# Patient Record
Sex: Male | Born: 1990 | Race: White | Hispanic: No | Marital: Single | Smoking: Never smoker
Health system: Southern US, Community
[De-identification: ages and names within clinical notes are randomized; demographics above are authoritative.]

## PROBLEM LIST (undated history)

## (undated) DIAGNOSIS — S02609A Fracture of mandible, unspecified, initial encounter for closed fracture: Secondary | ICD-10-CM

## (undated) HISTORY — DX: Fracture of mandible, unspecified, initial encounter for closed fracture: S02.609A

---
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
## 2016-08-22 ENCOUNTER — Ambulatory Visit (HOSPITAL_COMMUNITY)
Admission: EM | Admit: 2016-08-22 | Discharge: 2016-08-22 | Disposition: A | Discharge status: Home / Self Care | Payer: Self-pay | Attending: Internal Medicine | Admitting: Internal Medicine

## 2016-08-22 ENCOUNTER — Encounter (HOSPITAL_COMMUNITY): Payer: Self-pay | Admitting: Emergency Medicine

## 2016-08-22 DIAGNOSIS — T23241A Burn of second degree of multiple right fingers (nail), including thumb, initial encounter: Secondary | ICD-10-CM

## 2016-08-22 DIAGNOSIS — Z23 Encounter for immunization: Secondary | ICD-10-CM

## 2016-08-22 DIAGNOSIS — T24231A Burn of second degree of right lower leg, initial encounter: Secondary | ICD-10-CM

## 2016-08-22 DIAGNOSIS — T24201A Burn of second degree of unspecified site of right lower limb, except ankle and foot, initial encounter: Secondary | ICD-10-CM

## 2016-08-22 DIAGNOSIS — Y26XXXA Exposure to smoke, fire and flames, undetermined intent, initial encounter: Secondary | ICD-10-CM

## 2016-08-22 MED ORDER — TETANUS-DIPHTH-ACELL PERTUSSIS 5-2.5-18.5 LF-MCG/0.5 IM SUSP
0.5000 mL | Freq: Once | INTRAMUSCULAR | Status: AC
  Administered 2016-08-22: 0.5 mL via INTRAMUSCULAR

## 2016-08-22 MED ORDER — BACITRACIN ZINC 500 UNIT/GM EX OINT: TOPICAL_OINTMENT | Freq: Two times a day (BID) | CUTANEOUS | Status: DC

## 2016-08-22 MED ORDER — TETANUS-DIPHTH-ACELL PERTUSSIS 5-2.5-18.5 LF-MCG/0.5 IM SUSP
INTRAMUSCULAR | Status: AC
  Filled 2016-08-22: qty 0.5

## 2016-08-22 MED ORDER — SILVER SULFADIAZINE 1 % EX CREA: 1.0000 "application " | TOPICAL_CREAM | Freq: Every day | CUTANEOUS | 1 refills | Status: AC

## 2016-08-22 MED ORDER — HYDROCODONE-ACETAMINOPHEN 5-325 MG PO TABS: 2.0000 | ORAL_TABLET | ORAL | 0 refills | Status: AC | PRN

## 2016-08-22 MED ORDER — SILVER SULFADIAZINE 1 % EX CREA
TOPICAL_CREAM | Freq: Two times a day (BID) | CUTANEOUS | Status: DC
  Administered 2016-08-22: 12:00:00 via TOPICAL
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
